# Patient Record
Sex: Female | Born: 1995
Health system: Southern US, Community
[De-identification: ages and names within clinical notes are randomized; demographics above are authoritative.]

## PROBLEM LIST (undated history)

## (undated) DIAGNOSIS — T7840XA Allergy, unspecified, initial encounter: Secondary | ICD-10-CM

## (undated) HISTORY — DX: Allergy, unspecified, initial encounter: T78.40XA

---
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
## 2015-02-21 ENCOUNTER — Encounter (HOSPITAL_COMMUNITY): Payer: Self-pay

## 2015-02-21 ENCOUNTER — Emergency Department (HOSPITAL_COMMUNITY)
Admission: EM | Admit: 2015-02-21 | Discharge: 2015-02-22 | Disposition: A | Discharge status: Home / Self Care | Payer: BLUE CROSS/BLUE SHIELD | Attending: Emergency Medicine | Admitting: Emergency Medicine

## 2015-02-21 ENCOUNTER — Emergency Department (HOSPITAL_COMMUNITY): Payer: BLUE CROSS/BLUE SHIELD

## 2015-02-21 DIAGNOSIS — Z3202 Encounter for pregnancy test, result negative: Secondary | ICD-10-CM | POA: Insufficient documentation

## 2015-02-21 DIAGNOSIS — Z79899 Other long term (current) drug therapy: Secondary | ICD-10-CM | POA: Diagnosis not present

## 2015-02-21 DIAGNOSIS — N8320 Unspecified ovarian cysts: Secondary | ICD-10-CM | POA: Diagnosis not present

## 2015-02-21 DIAGNOSIS — R110 Nausea: Secondary | ICD-10-CM

## 2015-02-21 DIAGNOSIS — N83201 Unspecified ovarian cyst, right side: Secondary | ICD-10-CM

## 2015-02-21 DIAGNOSIS — Z792 Long term (current) use of antibiotics: Secondary | ICD-10-CM | POA: Diagnosis not present

## 2015-02-21 DIAGNOSIS — R102 Pelvic and perineal pain: Secondary | ICD-10-CM

## 2015-02-21 DIAGNOSIS — N83202 Unspecified ovarian cyst, left side: Secondary | ICD-10-CM

## 2015-02-21 DIAGNOSIS — R1031 Right lower quadrant pain: Secondary | ICD-10-CM | POA: Diagnosis present

## 2015-02-21 LAB — CBC WITH DIFFERENTIAL/PLATELET
Basophils Absolute: 0 10*3/uL (ref 0.0–0.1)
Basophils Relative: 0 % (ref 0–1)
EOS ABS: 0.2 10*3/uL (ref 0.0–0.7)
Eosinophils Relative: 1 % (ref 0–5)
HCT: 44.7 % (ref 36.0–46.0)
Hemoglobin: 15.2 g/dL — ABNORMAL HIGH (ref 12.0–15.0)
LYMPHS ABS: 1.9 10*3/uL (ref 0.7–4.0)
LYMPHS PCT: 12 % (ref 12–46)
MCH: 29.2 pg (ref 26.0–34.0)
MCHC: 34 g/dL (ref 30.0–36.0)
MCV: 85.8 fL (ref 78.0–100.0)
Monocytes Absolute: 1 10*3/uL (ref 0.1–1.0)
Monocytes Relative: 6 % (ref 3–12)
NEUTROS PCT: 81 % — AB (ref 43–77)
Neutro Abs: 13.6 10*3/uL — ABNORMAL HIGH (ref 1.7–7.7)
PLATELETS: 319 10*3/uL (ref 150–400)
RBC: 5.21 MIL/uL — ABNORMAL HIGH (ref 3.87–5.11)
RDW: 12 % (ref 11.5–15.5)
WBC: 16.7 10*3/uL — AB (ref 4.0–10.5)

## 2015-02-21 LAB — URINALYSIS, ROUTINE W REFLEX MICROSCOPIC
BILIRUBIN URINE: NEGATIVE
Glucose, UA: NEGATIVE mg/dL
HGB URINE DIPSTICK: NEGATIVE
KETONES UR: NEGATIVE mg/dL
Nitrite: NEGATIVE
PROTEIN: NEGATIVE mg/dL
SPECIFIC GRAVITY, URINE: 1.029 (ref 1.005–1.030)
Urobilinogen, UA: 0.2 mg/dL (ref 0.0–1.0)
pH: 6 (ref 5.0–8.0)

## 2015-02-21 LAB — LIPASE, BLOOD: Lipase: 23 U/L (ref 22–51)

## 2015-02-21 LAB — URINE MICROSCOPIC-ADD ON

## 2015-02-21 LAB — COMPREHENSIVE METABOLIC PANEL
ALT: 16 U/L (ref 14–54)
ANION GAP: 9 (ref 5–15)
AST: 16 U/L (ref 15–41)
Albumin: 4.3 g/dL (ref 3.5–5.0)
Alkaline Phosphatase: 66 U/L (ref 38–126)
BILIRUBIN TOTAL: 0.2 mg/dL — AB (ref 0.3–1.2)
BUN: 16 mg/dL (ref 6–20)
CO2: 25 mmol/L (ref 22–32)
CREATININE: 0.79 mg/dL (ref 0.44–1.00)
Calcium: 9.3 mg/dL (ref 8.9–10.3)
Chloride: 104 mmol/L (ref 101–111)
GFR calc non Af Amer: >60 mL/min (ref >60)
GLUCOSE: 125 mg/dL — AB (ref 65–99)
POTASSIUM: 4.1 mmol/L (ref 3.5–5.1)
Sodium: 138 mmol/L (ref 135–145)
Total Protein: 8 g/dL (ref 6.5–8.1)

## 2015-02-21 LAB — POC URINE PREG, ED: Preg Test, Ur: NEGATIVE

## 2015-02-21 MED ORDER — IOHEXOL 300 MG/ML  SOLN
50.0000 mL | Freq: Once | INTRAMUSCULAR | Status: AC | PRN
  Administered 2015-02-21: 50 mL via ORAL

## 2015-02-21 MED ORDER — HYDROCODONE-ACETAMINOPHEN 5-325 MG PO TABS
1.0000 | ORAL_TABLET | Freq: Once | ORAL | Status: AC
  Administered 2015-02-22: 1 via ORAL
  Filled 2015-02-21: qty 1

## 2015-02-21 MED ORDER — SODIUM CHLORIDE 0.9 % IV BOLUS (SEPSIS)
1000.0000 mL | Freq: Once | INTRAVENOUS | Status: AC
  Administered 2015-02-21: 1000 mL via INTRAVENOUS

## 2015-02-21 MED ORDER — IOHEXOL 300 MG/ML  SOLN
100.0000 mL | Freq: Once | INTRAMUSCULAR | Status: AC | PRN
  Administered 2015-02-21: 100 mL via INTRAVENOUS

## 2015-02-21 MED ORDER — HYDROCODONE-ACETAMINOPHEN 5-325 MG PO TABS: ORAL_TABLET | ORAL | Status: AC

## 2015-02-21 NOTE — ED Notes (Signed)
Pt transported to CT ?

## 2015-02-21 NOTE — ED Notes (Signed)
I attempted to collect labs and was unsuccessful.  I made nurse aware. 

## 2015-02-21 NOTE — ED Provider Notes (Signed)
CSN: [id number]     Arrival date & time 02/21/15  1850 History   First MD Initiated Contact with Patient 02/21/15 2156     Chief Complaint  Patient presents with  . Abdominal Pain  . Nausea     (Consider location/radiation/quality/duration/timing/severity/associated sxs/prior Treatment) HPI   Blood pressure 131/66, pulse 97, temperature 98.2 F (36.8 C), temperature source Oral, resp. rate 18, weight 190 lb (86.183 kg), last menstrual period 01/31/2015, SpO2 100 %.  Cassie Hurst is a 19 y.o. female complaining of is an otherwise healthy 19 y/o female who presents to the ED with 5/10 lower abdominal pain, aggravated by movement.  She states that she was at work as a Public relations account executivelifeguard when she suddenly began having sharp RLQ pain, she had chills, nausea and her mother states that she was pale and diaphoretic.  She has not eaten since then, her last meal was around 2:30.  After 1.5 hrs her pain subsided some to her current state, she states as long as she can lay still she feels ok, her mother reports her having pain on the car ride over whenever they hit a bump.  Her LMP was 3 wks ago, she is not having any vaginal discharge, and is not currently sexually active.  She reports having some burning with urination when giving a urine sample and had some low , but had none prior to now.  She denies having any fever, vomiting, diarrhea, constipation, or h/o abdominal surgery.      Past Medical History  Diagnosis Date  . Allergy    History reviewed. No pertinent past surgical history. Family History  Problem Relation Age of Onset  . Hyperlipidemia Mother   . Hyperlipidemia Paternal Grandmother   . Heart disease Paternal Grandfather   . Hyperlipidemia Paternal Grandfather    History  Substance Use Topics  . Smoking status: Never Smoker   . Smokeless tobacco: Not on file  . Alcohol Use: Yes     Comment: occ   OB History    No data available     Review of Systems  10 systems reviewed and  found to be negative, except as noted in the HPI.  Allergies  Review of patient's allergies indicates no known allergies.  Home Medications   Prior to Admission medications   Medication Sig Start Date End Date Taking? Authorizing Provider  amoxicillin (AMOXIL) 875 MG tablet Take 1 tablet (875 mg total) by mouth 2 (two) times daily. 02/05/14   Tonye Pearsonobert P Doolittle, MD  diphenhydrAMINE (BENADRYL) 25 mg capsule Take 25 mg by mouth every 6 (six) hours as needed.    Historical Provider, MD  fexofenadine (ALLEGRA) 30 MG tablet Take 30 mg by mouth 2 (two) times daily.    Historical Provider, MD   BP 129/70 mmHg  Pulse 83  Temp(Src) 98.2 F (36.8 C) (Oral)  Resp 16  Wt 190 lb (86.183 kg)  SpO2 100%  LMP 01/31/2015 (Approximate) Physical Exam  Constitutional: She is oriented to person, place, and time. She appears well-developed and well-nourished. No distress.  HENT:  Head: Normocephalic.  Eyes: Conjunctivae and EOM are normal.  Cardiovascular: Normal rate.   Pulmonary/Chest: Effort normal. No stridor.  Abdominal: There is tenderness.  Tenderness over McBurney's point, Rovsing and psoas are positive. Obturator is negative. No guarding or rebound. Patient has normoactive bowel sounds.  Musculoskeletal: Normal range of motion.  Neurological: She is alert and oriented to person, place, and time.  Psychiatric: She has a normal mood  and affect.  Nursing note and vitals reviewed.   ED Course  Procedures (including critical care time) Labs Review Labs Reviewed  CBC WITH DIFFERENTIAL/PLATELET - Abnormal; Notable for the following:    WBC 16.7 (*)    RBC 5.21 (*)    Hemoglobin 15.2 (*)    Neutrophils Relative % 81 (*)    Neutro Abs 13.6 (*)    All other components within normal limits  COMPREHENSIVE METABOLIC PANEL - Abnormal; Notable for the following:    Glucose, Bld 125 (*)    Total Bilirubin 0.2 (*)    All other components within normal limits  URINALYSIS, ROUTINE W REFLEX  MICROSCOPIC (NOT AT [facility]) - Abnormal; Notable for the following:    Leukocytes, UA SMALL (*)    All other components within normal limits  URINE MICROSCOPIC-ADD ON - Abnormal; Notable for the following:    Squamous Epithelial / LPF FEW (*)    All other components within normal limits  LIPASE, BLOOD  POC URINE PREG, ED    Imaging Review No results found.   EKG Interpretation None      MDM   Final diagnoses:  None    Filed Vitals:   02/21/15 1857 02/21/15 2127 02/21/15 2358  BP: 142/73 129/70 131/66  Pulse: 80 83 97  Temp: 98.2 F (36.8 C)  98.2 F (36.8 C)  TempSrc: Oral  Oral  Resp: Weight: 190 lb (86.183 kg)    SpO2: 100% 100% 100%    Medications  sodium chloride 0.9 % bolus 1,000 mL (0 mLs Intravenous Stopped 02/22/15 0002)  iohexol (OMNIPAQUE) 300 MG/ML solution 50 mL (50 mLs Oral Contrast Given 02/21/15 2311)  iohexol (OMNIPAQUE) 300 MG/ML solution 100 mL (100 mLs Intravenous Contrast Given 02/21/15 2312)  HYDROcodone-acetaminophen (NORCO/VICODIN) 5-325 MG per tablet 1 tablet (1 tablet Oral Given 02/22/15 0001)    Cassie Hurst is a pleasant 19 y.o. female presenting with acute onset of right lower quadrant pain with nausea. No associated fever, emesis, diarrhea, abnormal vaginal discharge. Abdominal exam is nonsurgical however in concern for appendicitis versus torsion versus ovarian cyst. CT shows a partially collapsed right ovarian cyst, normal appendix. Ultrasound of the pelvis shows normal blood flow, urologic amount of free fluid in the pelvis. Offered to proceed with pelvic exam and patient declined. She has outpatient OB/GYN care. Patient was encouraged to follow closely with OB/GYN, will write prescription for Vicodin. Repeat abdominal exam is nonsurgical.  Evaluation does not show pathology that would require ongoing emergent intervention or inpatient treatment. Pt is hemodynamically stable and mentating appropriately. Discussed findings and plan  with patient/guardian, who agrees with care plan. All questions answered. Return precautions discussed and outpatient follow up given.   Discharge Medication List as of 02/21/2015 11:55 PM    START taking these medications   Details  HYDROcodone-acetaminophen (NORCO/VICODIN) 5-325 MG per tablet Take 1-2 tablets by mouth every 6 hours as needed for pain and/or cough., Print             [place], PA-C 02/22/15 1610  Arby Barrette, MD 02/24/15 1034

## 2015-02-21 NOTE — Discharge Instructions (Signed)
For pain control please take ibuprofen (also known as Motrin or Advil)  (this is normally 4 over the counter pills) 3 times a day  for 5 days. Take with food to minimize stomach irritation.   Take vicodin for breakthrough pain, do not drink alcohol, drive, care for children or do other critical tasks while taking vicodin.  Please follow with your primary care doctor in the next 2 days for a check-up. They must obtain records for further management.   Do not hesitate to return to the Emergency Department for any new, worsening or concerning symptoms.    Ovarian Cyst An ovarian cyst is a fluid-filled sac that forms on an ovary. The ovaries are small organs that produce eggs in women. Various types of cysts can form on the ovaries. Most are not cancerous. Many do not cause problems, and they often go away on their own. Some may cause symptoms and require treatment. Common types of ovarian cysts include:  Functional cysts--These cysts may occur every month during the menstrual cycle. This is normal. The cysts usually go away with the next menstrual cycle if the woman does not get pregnant. Usually, there are no symptoms with a functional cyst.  Endometrioma cysts--These cysts form from the tissue that lines the uterus. They are also called "chocolate cysts" because they become filled with blood that turns brown. This type of cyst can cause pain in the lower abdomen during intercourse and with your menstrual period.  Cystadenoma cysts--This type develops from the cells on the outside of the ovary. These cysts can get very big and cause lower abdomen pain and pain with intercourse. This type of cyst can twist on itself, cut off its blood supply, and cause severe pain. It can also easily rupture and cause a lot of pain.  Dermoid cysts--This type of cyst is sometimes found in both ovaries. These cysts may contain different kinds of body tissue, such as skin, teeth, hair, or cartilage. They usually do  not cause symptoms unless they get very big.  Theca lutein cysts--These cysts occur when too much of a certain hormone (human chorionic gonadotropin) is produced and overstimulates the ovaries to produce an egg. This is most common after procedures used to assist with the conception of a baby (in vitro fertilization). CAUSES   Fertility drugs can cause a condition in which multiple large cysts are formed on the ovaries. This is called ovarian hyperstimulation syndrome.  A condition called polycystic ovary syndrome can cause hormonal imbalances that can lead to nonfunctional ovarian cysts. SIGNS AND SYMPTOMS  Many ovarian cysts do not cause symptoms. If symptoms are present, they may include:  Pelvic pain or pressure.  Pain in the lower abdomen.  Pain during sexual intercourse.  Increasing girth (swelling) of the abdomen.  Abnormal menstrual periods.  Increasing pain with menstrual periods.  Stopping having menstrual periods without being pregnant. DIAGNOSIS  These cysts are commonly found during a routine or annual pelvic exam. Tests may be ordered to find out more about the cyst. These tests may include:  Ultrasound.  X-ray of the pelvis.  CT scan.  MRI.  Blood tests. TREATMENT  Many ovarian cysts go away on their own without treatment. Your health care provider may want to check your cyst regularly for 2-3 months to see if it changes. For women in menopause, it is particularly important to monitor a cyst closely because of the higher rate of ovarian cancer in menopausal women. When treatment is needed, it may  include any of the following:  A procedure to drain the cyst (aspiration). This may be done using a long needle and ultrasound. It can also be done through a laparoscopic procedure. This involves using a thin, lighted tube with a tiny camera on the end (laparoscope) inserted through a small incision.  Surgery to remove the whole cyst. This may be done using  laparoscopic surgery or an open surgery involving a larger incision in the lower abdomen.  Hormone treatment or birth control pills. These methods are sometimes used to help dissolve a cyst. HOME CARE INSTRUCTIONS   Only take over-the-counter or prescription medicines as directed by your health care provider.  Follow up with your health care provider as directed.  Get regular pelvic exams and Pap tests. SEEK MEDICAL CARE IF:   Your periods are late, irregular, or painful, or they stop.  Your pelvic pain or abdominal pain does not go away.  Your abdomen becomes larger or swollen.  You have pressure on your bladder or trouble emptying your bladder completely.  You have pain during sexual intercourse.  You have feelings of fullness, pressure, or discomfort in your stomach.  You lose weight for no apparent reason.  You feel generally ill.  You become constipated.  You lose your appetite.  You develop acne.  You have an increase in body and facial hair.  You are gaining weight, without changing your exercise and eating habits.  You think you are pregnant. SEEK IMMEDIATE MEDICAL CARE IF:   You have increasing abdominal pain.  You feel sick to your stomach (nauseous), and you throw up (vomit).  You develop a fever that comes on suddenly.  You have abdominal pain during a bowel movement.  Your menstrual periods become heavier than usual. MAKE SURE YOU:  Understand these instructions.  Will watch your condition.  Will get help right away if you are not doing well or get worse. Document Released: 08/12/2005 Document Revised: 08/17/2013 Document Reviewed: 04/19/2013 [employer] Patient Information 2015 [employer], [place]. This information is not intended to replace advice given to you by your health care provider. Make sure you discuss any questions you have with your health care provider.

## 2015-02-21 NOTE — ED Notes (Signed)
US bedside

## 2015-02-21 NOTE — ED Notes (Signed)
Pt c/o lower abdominal pain and nausea starting this afternoon.  Pain score 9/10.  Sts movement increases pain.  Pt has not taken anything for complaints.  Denies being around anyone sick.  Denies GU complaint.

## 2015-02-21 NOTE — ED Notes (Signed)
Pelvic supplies at bedside. 

## 2015-02-21 NOTE — ED Notes (Signed)
Pt drinking CT contrast. US technician at bedside.

## 2015-02-22 LAB — RPR: RPR: NONREACTIVE

## 2015-02-22 LAB — HIV ANTIBODY (ROUTINE TESTING W REFLEX): HIV Screen 4th Generation wRfx: NONREACTIVE

## 2015-02-22 NOTE — ED Notes (Signed)
RN explained discharge paperwork to pt and pt's mother including information about ovarian cyst, the importance of follow up care, and Rx. Pt verbalized understanding.

## 2016-01-05 ENCOUNTER — Ambulatory Visit (INDEPENDENT_AMBULATORY_CARE_PROVIDER_SITE_OTHER): Payer: BLUE CROSS/BLUE SHIELD | Admitting: Family Medicine

## 2016-01-05 VITALS — BP 108/74 | HR 85 | Temp 97.6°F | Resp 18 | Ht 64.0 in | Wt 204.0 lb

## 2016-01-05 DIAGNOSIS — J301 Allergic rhinitis due to pollen: Secondary | ICD-10-CM | POA: Diagnosis not present

## 2016-01-05 MED ORDER — MONTELUKAST SODIUM 10 MG PO TABS: 10.0000 mg | ORAL_TABLET | Freq: Every day | ORAL | Status: AC

## 2016-01-05 NOTE — Progress Notes (Signed)
Subjective:    Patient ID: Cassie Hurst, female    DOB: [birth year], 20 y.o.   MRN: [id number]  01/05/2016  sinus congestion   HPI This 20 y.o. female presents for evaluation of allergies and sinus congestion.  Has chronic allergic rhinitis.  Last year, had good control of allergies with Singulair and Allegra.  Has not been able to follow-up with provider for Singulair; has been taking Allegra.  Yesterday went horseback riding and allergies acutely worsened.  Has been suffering with congestion for three weeks with acute worsening since yesterday. No fever/chills/sweats. No headache.  No ear pain or sore throat.  +sinus pressure frontal region.  +rhinorrhea green this morning but had been clear.  +nasal congestion.  +PND.  +coughing mild.  No wheezing. No asthma.  +sneezing.  No SOB.  +itching everywhere.  No tobacco.  Has Flonase but does not use regularly.   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  Not sure of career plans.  [place].     Review of Systems  Constitutional: Negative for fever, chills, diaphoresis and fatigue.  HENT: Positive for congestion, postnasal drip, rhinorrhea, sinus pressure, sneezing and voice change. Negative for ear discharge, ear pain, hearing loss, sore throat and trouble swallowing.   Eyes: Negative for visual disturbance.  Respiratory: Positive for cough. Negative for shortness of breath and wheezing.   Cardiovascular: Negative for chest pain, palpitations and leg swelling.  Gastrointestinal: Negative for nausea, vomiting, abdominal pain, diarrhea and constipation.  Endocrine: Negative for cold intolerance, heat intolerance, polydipsia, polyphagia and polyuria.  Neurological: Negative for dizziness, tremors, seizures, syncope, facial asymmetry, speech difficulty, weakness, light-headedness, numbness and headaches.    Past Medical History  Diagnosis Date  . Allergy    History reviewed. No pertinent past surgical history. No Known  Allergies  Social History   Social History  . Marital Status: Single    Spouse Name: N/A  . Number of Children: N/A  . Years of Education: N/A   Occupational History  . Not on file.   Social History Main Topics  . Smoking status: Never Smoker   . Smokeless tobacco: Not on file  . Alcohol Use: Yes     Comment: occ  . Drug Use: No  . Sexual Activity: Not on file   Other Topics Concern  . Not on file   Social History Narrative   Family History  Problem Relation Age of Onset  . Hyperlipidemia Mother   . Hyperlipidemia Paternal Grandmother   . Heart disease Paternal Grandfather   . Hyperlipidemia Paternal Grandfather   . Hyperlipidemia Father        Objective:    BP 108/74 mmHg  Pulse 85  Temp(Src) 97.6 F (36.4 C) (Oral)  Resp 18  Ht  (1.626 m)  Wt 204 lb (92.534 kg)  BMI 35.00 kg/m2  SpO2 98%  LMP 12/10/2015 Physical Exam  Constitutional: She is oriented to person, place, and time. She appears well-developed and well-nourished. No distress.  HENT:  Head: Normocephalic and atraumatic.  Right Ear: External ear and ear canal normal. Tympanic membrane is retracted.  Left Ear: External ear and ear canal normal. Tympanic membrane is retracted.  Nose: Nose normal. Right sinus exhibits no maxillary sinus tenderness and no frontal sinus tenderness. Left sinus exhibits no maxillary sinus tenderness and no frontal sinus tenderness.  Mouth/Throat: Uvula is midline, oropharynx is clear and moist and mucous membranes are normal. No oropharyngeal exudate, posterior oropharyngeal edema, posterior oropharyngeal erythema or  tonsillar abscesses.  Eyes: Conjunctivae are normal. Pupils are equal, round, and reactive to light.  Neck: Normal range of motion. Neck supple.  Cardiovascular: Normal rate, regular rhythm and normal heart sounds.  Exam reveals no gallop and no friction rub.   No murmur heard. Pulmonary/Chest: Effort normal and breath sounds normal. She has no wheezes.  She has no rales.  Lymphadenopathy:    She has no cervical adenopathy.  Neurological: She is alert and oriented to person, place, and time.  Skin: She is not diaphoretic.  Psychiatric: She has a normal mood and affect. Her behavior is normal.  Nursing note and vitals reviewed.       Assessment & Plan:   1. Seasonal allergic rhinitis due to pollen    -uncontrolled. -rx for Singulair provided. -continue Allegra daily. -increase Flonase to daily. -continue Afrin bid for three days then decrease to qhs for two days and then stop. -call if no improvement in one week for abx for secondary sinusitis.   No orders of the defined types were placed in this encounter.   Meds ordered this encounter  Medications  . montelukast (SINGULAIR) 10 MG tablet    Sig: Take 1 tablet (10 mg total) by mouth at bedtime.    Dispense:  30 tablet    Refill:  11    No Follow-up on file.    Kristi Paulita FujitaMartin Smith, M.D. [facility]  [place] [address] [place], [place]  [address] [phone] phone [phone] fax

## 2016-01-05 NOTE — Patient Instructions (Addendum)
1. Continue Allegra daily. 2.  Continue Flonase for next week. 3.  Continue Afrin for maximum of five days. 4.  Start Singulair.  Allergic Rhinitis Allergic rhinitis is when the mucous membranes in the nose respond to allergens. Allergens are particles in the air that cause your body to have an allergic reaction. This causes you to release allergic antibodies. Through a chain of events, these eventually cause you to release histamine into the blood stream. Although meant to protect the body, it is this release of histamine that causes your discomfort, such as frequent sneezing, congestio  n, and an itchy, runny nose.  CAUSES Seasonal allergic rhinitis (hay fever) is caused by pollen allergens that may come from grasses, trees, and weeds. Year-round allergic rhinitis (perennial allergic rhinitis) is caused by allergens such as house dust mites, pet dander, and mold spores. SYMPTOMS  Nasal stuffiness (congestion).  Itchy, runny nose with sneezing and tearing of the eyes. DIAGNOSIS Your health care provider can help you determine the allergen or allergens that trigger your symptoms. If you and your health care provider are unable to determine the allergen, skin or blood testing may be used. Your health care provider will diagnose your condition after taking your health history and performing a physical exam. Your health care provider may assess you for other related conditions, such as asthma, pink eye, or an ear infection. TREATMENT Allergic rhinitis does not have a cure, but it can be controlled by:  Medicines that block allergy symptoms. These may include allergy shots, nasal sprays, and oral antihistamines.  Avoiding the allergen. Hay fever may often be treated with antihistamines in pill or nasal spray forms. Antihistamines block the effects of histamine. There are over-the-counter medicines that may help with nasal congestion and swelling around the eyes. Check with your health care  provider before taking or giving this medicine. If avoiding the allergen or the medicine prescribed do not work, there are many new medicines your health care provider can prescribe. Stronger medicine may be used if initial measures are ineffective. Desensitizing injections can be used if medicine and avoidance does not work. Desensitization is when a patient is given ongoing shots until the body becomes less sensitive to the allergen. Make sure you follow up with your health care provider if problems continue. HOME CARE INSTRUCTIONS It is not possible to completely avoid allergens, but you can reduce your symptoms by taking steps to limit your exposure to them. It helps to know exactly what you are allergic to so that you can avoid your specific triggers. SEEK MEDICAL CARE IF:  You have a fever.  You develop a cough that does not stop easily (persistent).  You have shortness of breath.  You start wheezing.  Symptoms interfere with normal daily activities.   This information is not intended to replace advice given to you by your health care provider. Make sure you discuss any questions you have with your health care provider.   Document Released: 05/07/2001 Document Revised: 09/02/2014 Document Reviewed: 04/19/2013 Elsevier Interactive Patient Education 2016 [employer].     IF you received an x-ray today, you will receive an invoice from [facility]. Please contact [facility] at [phone] with questions or concerns regarding your invoice.   IF you received labwork today, you will receive an invoice from [employer] [facility]. Please contact [facility] at [phone] with questions or concerns regarding your invoice.   Our billing staff will not be able to assist you with questions regarding bills  from these companies.  You will be contacted with the lab results as soon as they are available. The fastest way to get your results is to activate your  My Chart account. Instructions are located on the last page of this paperwork. If you have not heard from us regarding the results in 2 weeks, please contact this office.

## 2016-06-06 DIAGNOSIS — L089 Local infection of the skin and subcutaneous tissue, unspecified: Secondary | ICD-10-CM | POA: Diagnosis not present

## 2016-06-06 DIAGNOSIS — S80861A Insect bite (nonvenomous), right lower leg, initial encounter: Secondary | ICD-10-CM | POA: Diagnosis not present

## 2016-06-06 DIAGNOSIS — W57XXXA Bitten or stung by nonvenomous insect and other nonvenomous arthropods, initial encounter: Secondary | ICD-10-CM | POA: Diagnosis not present

## 2016-07-30 MED FILL — BLISOVI 24 FE TABLET: 1-20 | 84 days supply | Qty: 84 | Fill #0

## 2016-10-31 MED FILL — MONTELUKAST SOD 10 MG TAB: 10 | 30 days supply | Qty: 30 | Fill #0

## 2016-10-31 MED FILL — BLISOVI 24 FE TABLET: 1-20 | 84 days supply | Qty: 84 | Fill #1

## 2016-12-06 MED FILL — MONTELUKAST SOD 10 MG TAB: 10 | 30 days supply | Qty: 30 | Fill #1

## 2016-12-30 DIAGNOSIS — Z01419 Encounter for gynecological examination (general) (routine) without abnormal findings: Secondary | ICD-10-CM | POA: Diagnosis not present

## 2016-12-30 DIAGNOSIS — Z6836 Body mass index (BMI) 36.0-36.9, adult: Secondary | ICD-10-CM | POA: Diagnosis not present

## 2017-01-28 DIAGNOSIS — Z30017 Encounter for initial prescription of implantable subdermal contraceptive: Secondary | ICD-10-CM | POA: Diagnosis not present

## 2017-03-10 DIAGNOSIS — H5213 Myopia, bilateral: Secondary | ICD-10-CM | POA: Diagnosis not present

## 2017-04-26 IMAGING — US US ART/VEN ABD/PELV/SCROTUM DOPPLER LTD
1 series · 13 of 25 positions shown · non-contrast
Comparison: None.

CLINICAL DATA: Right lower quadrant pain and nausea.

EXAM:
TRANSABDOMINAL AND TRANSVAGINAL ULTRASOUND OF PELVIS
DOPPLER ULTRASOUND OF OVARIES
TECHNIQUE: Both transabdominal and transvaginal ultrasound examinations of the
pelvis were performed. Transabdominal technique was performed for
global imaging of the pelvis including uterus, ovaries, adnexal
regions, and pelvic cul-de-sac.
It was necessary to proceed with endovaginal exam following the
transabdominal exam to visualize the uterus and right ovary. Color
and duplex Doppler ultrasound was utilized to evaluate blood flow to
the ovaries.

[Series 1: us art/ven abd/pelv/scrotum doppler ltd · 0.22mm/px · 54 acquisitions, 13 frames shown]
[im 1/54]
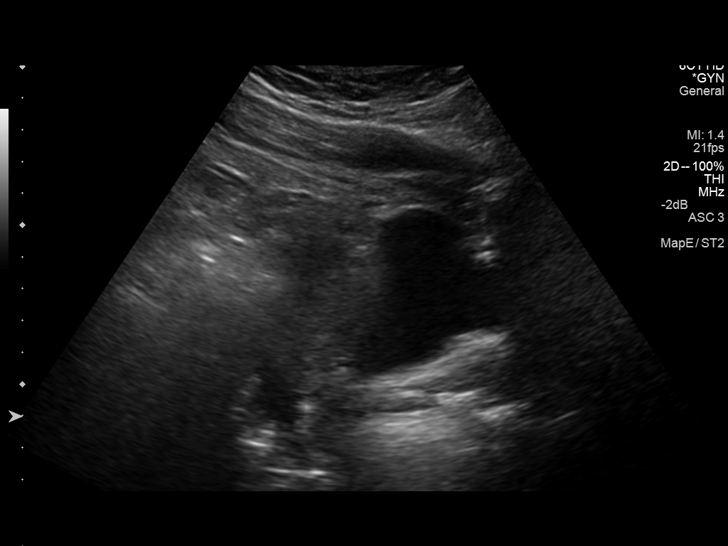
[im 5/54]
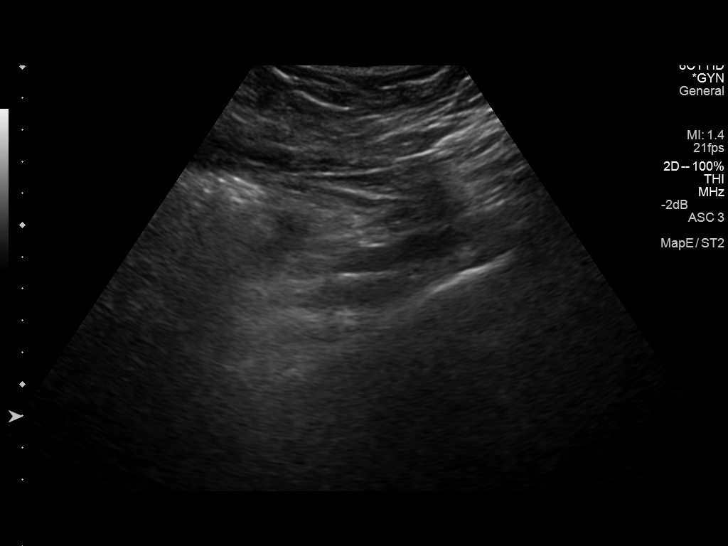
[im 9/54]
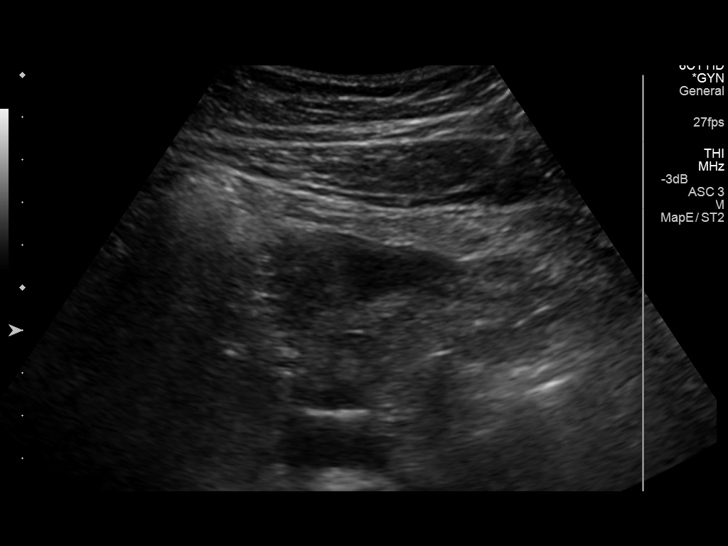
[im 14/54]
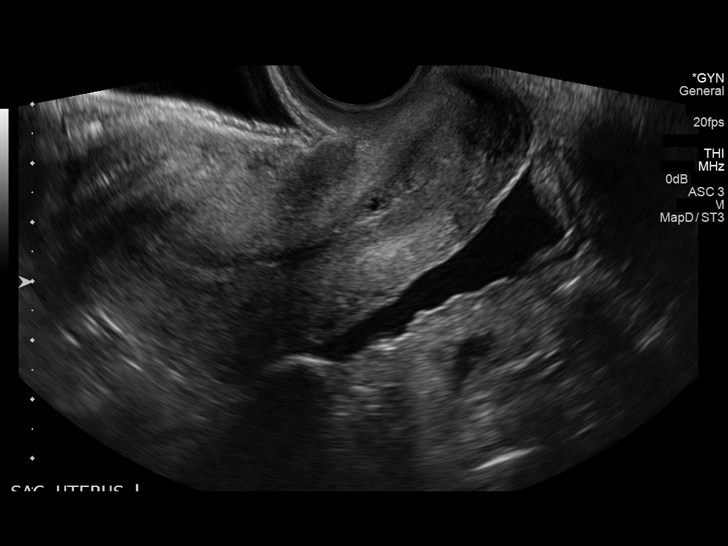
[im 18/54]
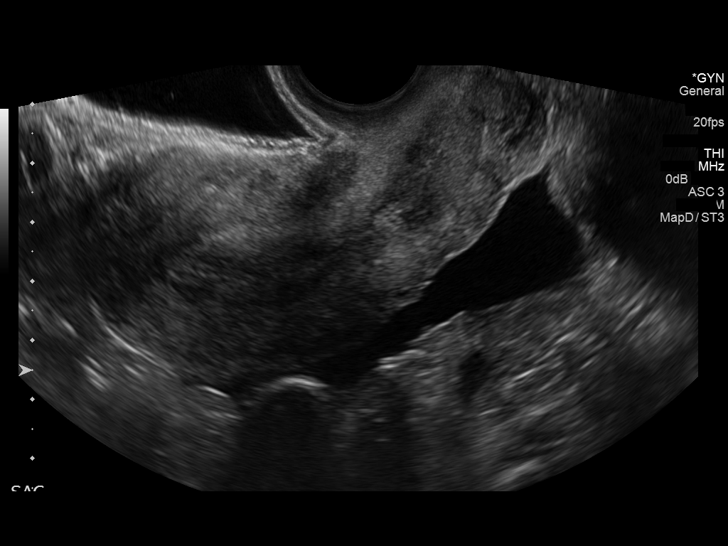
[im 23/54]
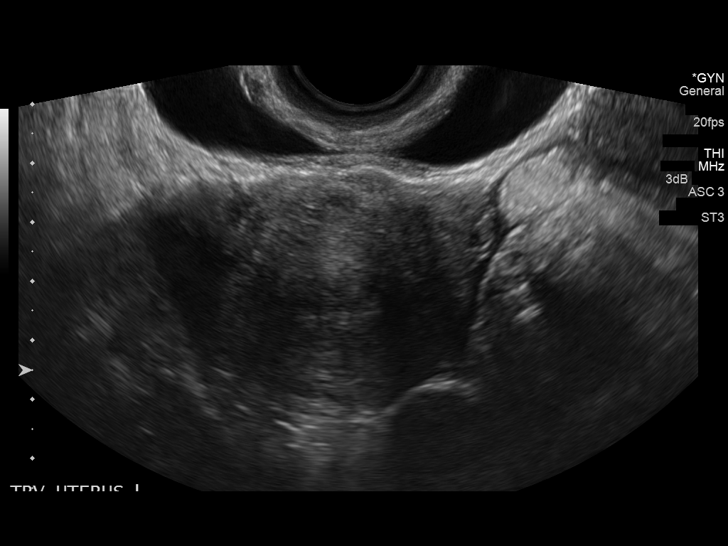
[im 27/54]
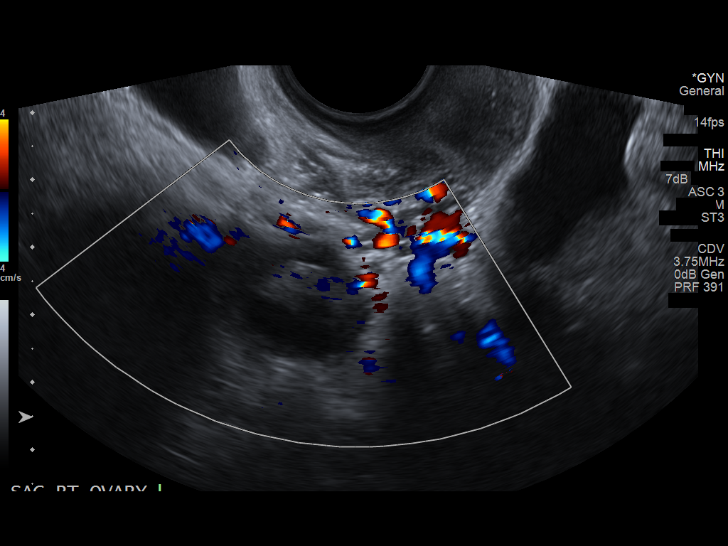
[im 31/54]
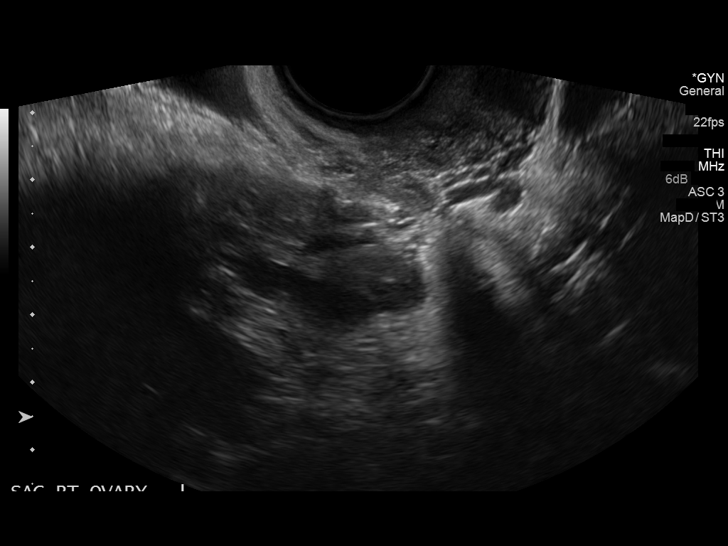
[im 36/54]
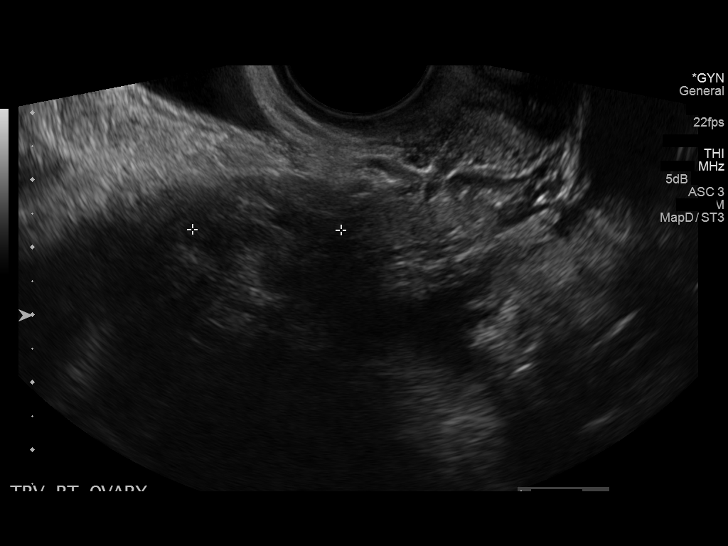
[im 40/54]
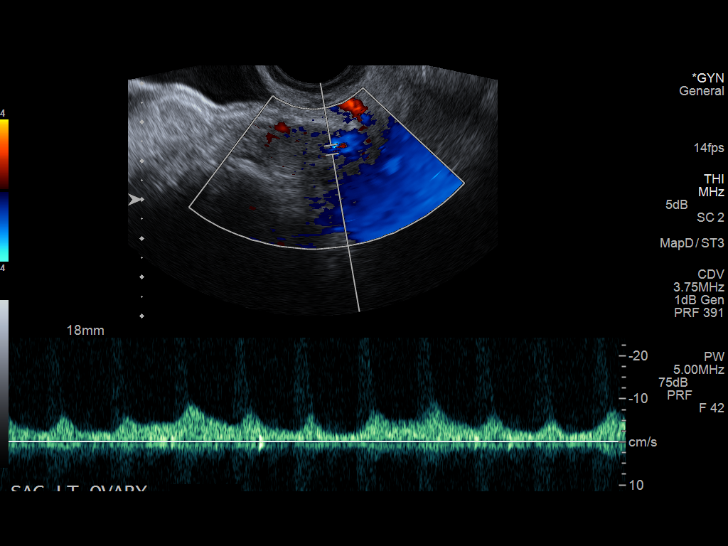
[im 45/54]
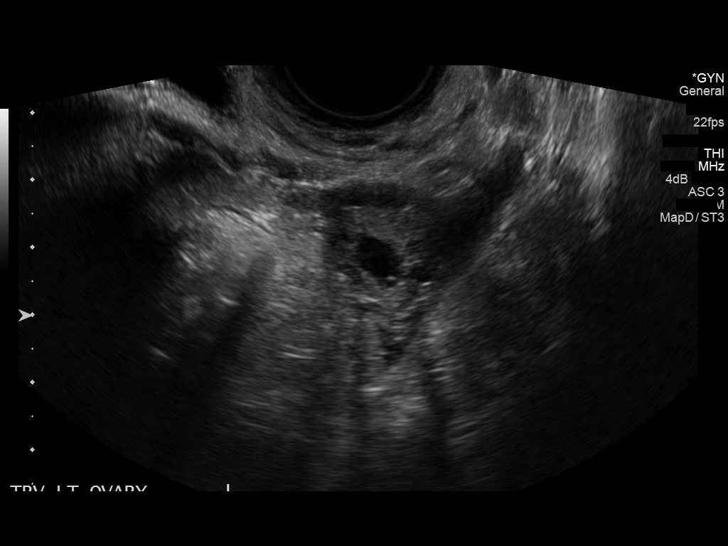
[im 49/54]
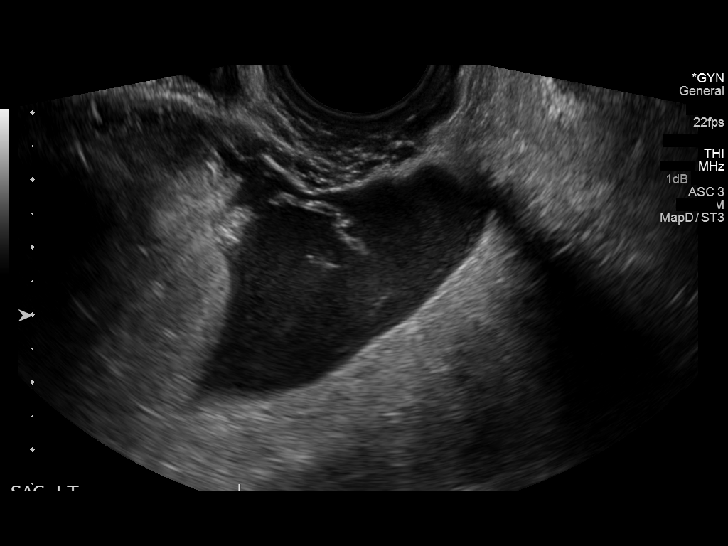
[im 54/54]
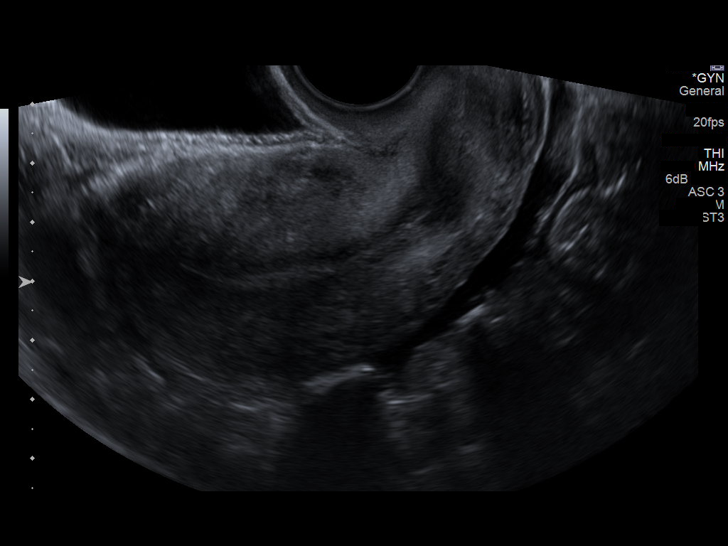

[13 of 25 positions shown; findings below may reference images not displayed]

FINDINGS: Uterus

Measurements: 8.2 x 3.8 x 4.7 cm. No fibroids or other mass
visualized.

Endometrium

Thickness: 3.3 mm.  No focal abnormality visualized.

Right ovary

Measurements: 3.8 x 2.1 x 2.2 cm. Normal appearance/no adnexal mass.
There is normal blood flow.

Left ovary

Measurements: 3.2 x 1.9 x 2.3 cm. Question corpus luteal cyst. No
adnexal mass. There is normal blood flow.

Pulsed Doppler evaluation of both ovaries demonstrates normal
low-resistance arterial and venous waveforms.

Other findings

Small volume of free fluid in the midline and left lower quadrant.
IMPRESSION: 1. Question corpus luteal cyst in the left ovary. Normal blood flow,
no torsion.
2. Normal sonographic appearance of the right ovary and uterus.
3. Small volume of free fluid in the pelvis, likely physiologic.

## 2017-05-02 DIAGNOSIS — R609 Edema, unspecified: Secondary | ICD-10-CM | POA: Diagnosis not present

## 2017-05-02 MED FILL — MONTELUKAST SOD 10 MG TAB: 10 | 45 days supply | Qty: 45 | Fill #0

## 2017-09-07 DIAGNOSIS — M25512 Pain in left shoulder: Secondary | ICD-10-CM | POA: Diagnosis not present

## 2017-10-30 DIAGNOSIS — J302 Other seasonal allergic rhinitis: Secondary | ICD-10-CM | POA: Diagnosis not present

## 2017-10-30 DIAGNOSIS — J3089 Other allergic rhinitis: Secondary | ICD-10-CM | POA: Diagnosis not present

## 2017-10-30 DIAGNOSIS — L089 Local infection of the skin and subcutaneous tissue, unspecified: Secondary | ICD-10-CM | POA: Diagnosis not present

## 2017-10-30 DIAGNOSIS — Z872 Personal history of diseases of the skin and subcutaneous tissue: Secondary | ICD-10-CM | POA: Diagnosis not present

## 2017-10-31 MED FILL — MINOCYCLINE 100 MG CAPSULE: 100 | 30 days supply | Qty: 60 | Fill #0

## 2017-10-31 MED FILL — MONTELUKAST SOD 10 MG TAB: 10 | 90 days supply | Qty: 90 | Fill #0

## 2017-10-31 MED FILL — SHIPPING COST: 1 days supply | Qty: 1 | Fill #0

## 2017-11-03 DIAGNOSIS — Z3009 Encounter for other general counseling and advice on contraception: Secondary | ICD-10-CM | POA: Diagnosis not present

## 2018-01-30 DIAGNOSIS — R1031 Right lower quadrant pain: Secondary | ICD-10-CM | POA: Diagnosis not present

## 2018-01-30 DIAGNOSIS — R300 Dysuria: Secondary | ICD-10-CM | POA: Diagnosis not present

## 2018-04-22 DIAGNOSIS — Z3009 Encounter for other general counseling and advice on contraception: Secondary | ICD-10-CM | POA: Diagnosis not present

## 2018-04-22 DIAGNOSIS — Z3046 Encounter for surveillance of implantable subdermal contraceptive: Secondary | ICD-10-CM | POA: Diagnosis not present

## 2018-05-04 MED FILL — MONTELUKAST SOD 10 MG TAB: 10 | 90 days supply | Qty: 90 | Fill #1

## 2018-05-04 MED FILL — SHIPPING COST: 1 days supply | Qty: 1 | Fill #1

## 2018-05-14 MED FILL — NORETHIN-ESTRAD-FERR 1-0.02: 1-20 | 84 days supply | Qty: 84 | Fill #0

## 2018-05-14 MED FILL — SHIPPING COST: 1 days supply | Qty: 1 | Fill #2

## 2018-08-11 DIAGNOSIS — Z0100 Encounter for examination of eyes and vision without abnormal findings: Secondary | ICD-10-CM | POA: Diagnosis not present

## 2018-08-11 DIAGNOSIS — H52203 Unspecified astigmatism, bilateral: Secondary | ICD-10-CM | POA: Diagnosis not present

## 2018-08-11 DIAGNOSIS — H5213 Myopia, bilateral: Secondary | ICD-10-CM | POA: Diagnosis not present
